# Patient Record
Sex: Male | Born: 1955 | Race: White | Hispanic: No | Smoking: Never smoker
Health system: Southern US, Community
[De-identification: ages and names within clinical notes are randomized; demographics above are authoritative.]

## PROBLEM LIST (undated history)

## (undated) DIAGNOSIS — E785 Hyperlipidemia, unspecified: Secondary | ICD-10-CM

## (undated) DIAGNOSIS — N289 Disorder of kidney and ureter, unspecified: Secondary | ICD-10-CM

## (undated) DIAGNOSIS — I10 Essential (primary) hypertension: Secondary | ICD-10-CM

---

## 2017-07-29 ENCOUNTER — Encounter: Payer: Self-pay | Admitting: Emergency Medicine

## 2017-07-29 ENCOUNTER — Emergency Department
Admission: EM | Admit: 2017-07-29 | Discharge: 2017-07-29 | Disposition: A | Discharge status: Home / Self Care | Payer: BLUE CROSS/BLUE SHIELD | Attending: Emergency Medicine | Admitting: Emergency Medicine

## 2017-07-29 ENCOUNTER — Emergency Department: Payer: BLUE CROSS/BLUE SHIELD

## 2017-07-29 DIAGNOSIS — E876 Hypokalemia: Secondary | ICD-10-CM | POA: Insufficient documentation

## 2017-07-29 DIAGNOSIS — I10 Essential (primary) hypertension: Secondary | ICD-10-CM | POA: Diagnosis not present

## 2017-07-29 DIAGNOSIS — R1031 Right lower quadrant pain: Secondary | ICD-10-CM | POA: Diagnosis not present

## 2017-07-29 DIAGNOSIS — N23 Unspecified renal colic: Secondary | ICD-10-CM

## 2017-07-29 DIAGNOSIS — R109 Unspecified abdominal pain: Secondary | ICD-10-CM

## 2017-07-29 HISTORY — DX: Hyperlipidemia, unspecified: E78.5

## 2017-07-29 HISTORY — DX: Disorder of kidney and ureter, unspecified: N28.9

## 2017-07-29 HISTORY — DX: Essential (primary) hypertension: I10

## 2017-07-29 LAB — BASIC METABOLIC PANEL
Anion gap: 10 (ref 5–15)
BUN: 16 mg/dL (ref 6–20)
CHLORIDE: 107 mmol/L (ref 101–111)
CO2: 25 mmol/L (ref 22–32)
Calcium: 8.9 mg/dL (ref 8.9–10.3)
Creatinine, Ser: 1.05 mg/dL (ref 0.61–1.24)
GFR calc Af Amer: >60 mL/min (ref >60)
GFR calc non Af Amer: >60 mL/min (ref >60)
Glucose, Bld: 160 mg/dL — ABNORMAL HIGH (ref 65–99)
POTASSIUM: 3.1 mmol/L — AB (ref 3.5–5.1)
SODIUM: 142 mmol/L (ref 135–145)

## 2017-07-29 LAB — URINALYSIS, COMPLETE (UACMP) WITH MICROSCOPIC
BACTERIA UA: NONE SEEN
Bilirubin Urine: NEGATIVE
GLUCOSE, UA: NEGATIVE mg/dL
KETONES UR: 5 mg/dL — AB
Leukocytes, UA: NEGATIVE
Nitrite: NEGATIVE
PH: 7 (ref 5.0–8.0)
Protein, ur: NEGATIVE mg/dL
SPECIFIC GRAVITY, URINE: 1.012 (ref 1.005–1.030)

## 2017-07-29 LAB — CBC
HEMATOCRIT: 42.8 % (ref 40.0–52.0)
HEMOGLOBIN: 15.1 g/dL (ref 13.0–18.0)
MCH: 29.9 pg (ref 26.0–34.0)
MCHC: 35.3 g/dL (ref 32.0–36.0)
MCV: 84.7 fL (ref 80.0–100.0)
Platelets: 237 10*3/uL (ref 150–440)
RBC: 5.05 MIL/uL (ref 4.40–5.90)
RDW: 13.5 % (ref 11.5–14.5)
WBC: 6 10*3/uL (ref 3.8–10.6)

## 2017-07-29 MED ORDER — HYDROMORPHONE HCL 1 MG/ML IJ SOLN
0.5000 mg | Freq: Once | INTRAMUSCULAR | Status: AC
  Administered 2017-07-29: 0.5 mg via INTRAVENOUS

## 2017-07-29 MED ORDER — POTASSIUM CHLORIDE CRYS ER 20 MEQ PO TBCR
40.0000 meq | EXTENDED_RELEASE_TABLET | Freq: Once | ORAL | Status: AC
  Administered 2017-07-29: 40 meq via ORAL
  Filled 2017-07-29: qty 2

## 2017-07-29 MED ORDER — OXYCODONE-ACETAMINOPHEN 5-325 MG PO TABS: 1.0000 | ORAL_TABLET | ORAL | 0 refills | Status: AC | PRN

## 2017-07-29 MED ORDER — TAMSULOSIN HCL 0.4 MG PO CAPS: 0.4000 mg | ORAL_CAPSULE | Freq: Every day | ORAL | 0 refills | Status: AC

## 2017-07-29 MED ORDER — SODIUM CHLORIDE 0.9 % IV BOLUS: 1000.0000 mL | Freq: Once | INTRAVENOUS | Status: DC

## 2017-07-29 MED ORDER — ONDANSETRON 4 MG PO TBDP: 4.0000 mg | ORAL_TABLET | Freq: Three times a day (TID) | ORAL | 0 refills | Status: AC | PRN

## 2017-07-29 MED ORDER — HYDROMORPHONE HCL 1 MG/ML IJ SOLN
0.5000 mg | Freq: Once | INTRAMUSCULAR | Status: AC
  Administered 2017-07-29: 0.5 mg via INTRAVENOUS
  Filled 2017-07-29: qty 1

## 2017-07-29 MED ORDER — SODIUM CHLORIDE 0.9 % IV BOLUS
1000.0000 mL | Freq: Once | INTRAVENOUS | Status: AC
  Administered 2017-07-29: 1000 mL via INTRAVENOUS

## 2017-07-29 MED ORDER — TAMSULOSIN HCL 0.4 MG PO CAPS
0.4000 mg | ORAL_CAPSULE | Freq: Once | ORAL | Status: AC
  Administered 2017-07-29: 0.4 mg via ORAL
  Filled 2017-07-29: qty 1

## 2017-07-29 MED ORDER — OXYCODONE-ACETAMINOPHEN 5-325 MG PO TABS
1.0000 | ORAL_TABLET | Freq: Once | ORAL | Status: AC
  Administered 2017-07-29: 1 via ORAL
  Filled 2017-07-29: qty 1

## 2017-07-29 MED ORDER — HYDROMORPHONE HCL 1 MG/ML IJ SOLN
INTRAMUSCULAR | Status: AC
  Filled 2017-07-29: qty 1

## 2017-07-29 MED ORDER — KETOROLAC TROMETHAMINE 30 MG/ML IJ SOLN
10.0000 mg | Freq: Once | INTRAMUSCULAR | Status: AC
  Administered 2017-07-29: 9.9 mg via INTRAVENOUS

## 2017-07-29 MED ORDER — ONDANSETRON HCL 4 MG/2ML IJ SOLN
4.0000 mg | Freq: Once | INTRAMUSCULAR | Status: AC
  Administered 2017-07-29: 4 mg via INTRAVENOUS
  Filled 2017-07-29: qty 2

## 2017-07-29 MED ORDER — KETOROLAC TROMETHAMINE 30 MG/ML IJ SOLN
INTRAMUSCULAR | Status: AC
  Filled 2017-07-29: qty 1

## 2017-07-29 NOTE — Discharge Instructions (Signed)
1.  You may take ibuprofen as needed for pain.  Take Percocet (#30) as needed for more severe pain. 2.  You may take Zofran (#30) as needed for nausea. 3.  Take Flomax daily for the next 2 weeks. 4.  Drink plenty of bottled or filtered water daily. 5.  Return to the ER for worsening symptoms, persistent vomiting, fever or other concerns.

## 2017-07-29 NOTE — ED Provider Notes (Signed)
[facility] Emergency Department Provider Note   ____________________________________________   First MD Initiated Contact with Patient 07/29/17 0406     (approximate)  I have reviewed the triage vital signs and the nursing notes.   HISTORY  Chief Complaint Flank Pain    HPI Douglas Chen is a 62 y.o. male who presents to the ED from home with a chief complaint of flank pain.  Patient awoke from sleep suddenly approximately 1:30 AM with right flank pain radiating to his right testicle.  Symptoms associated with nausea.  Remote history of left-sided kidney stone 20 years ago which did not require lithotripsy or stenting.  Denies associated fever, chills, chest pain, shortness of breath, hematuria.  Denies recent travel or trauma.   Past Medical History:  Diagnosis Date  . Hyperlipemia   . Hypertension   . Renal disorder    kidney stone    There are no active problems to display for this patient.   History reviewed. No pertinent surgical history.  Prior to Admission medications   Medication Sig Start Date End Date Taking? Authorizing Provider  ondansetron (ZOFRAN ODT) 4 MG disintegrating tablet Take 1 tablet (4 mg total) by mouth every 8 (eight) hours as needed for nausea or vomiting. 07/29/17   Irean Hong, MD  oxyCODONE-acetaminophen (PERCOCET/ROXICET) 5-325 MG tablet Take 1 tablet by mouth every 4 (four) hours as needed for severe pain. 07/29/17   Irean Hong, MD  tamsulosin (FLOMAX) 0.4 MG CAPS capsule Take 1 capsule (0.4 mg total) by mouth daily. 07/29/17   Irean Hong, MD    Allergies Patient has no known allergies.  No family history on file.  Social History Social History   Tobacco Use  . Smoking status: Never Smoker  . Smokeless tobacco: Never Used  Substance Use Topics  . Alcohol use: Not on file  . Drug use: Not on file    Review of Systems  Constitutional: No fever/chills. Eyes: No visual changes. ENT: No sore  throat. Cardiovascular: Denies chest pain. Respiratory: Denies shortness of breath. Gastrointestinal: Positive for right flank pain.  No abdominal pain.  No nausea, no vomiting.  No diarrhea.  No constipation. Genitourinary: Negative for dysuria. Musculoskeletal: Negative for back pain. Skin: Negative for rash. Neurological: Negative for headaches, focal weakness or numbness.   ____________________________________________   PHYSICAL EXAM:  VITAL SIGNS: ED Triage Vitals  Enc Vitals Group     BP 07/29/17 0333 (!) 110/91     Pulse Rate 07/29/17 0333 77     Resp 07/29/17 0333 18     Temp 07/29/17 0333 97.6 F (36.4 C)     Temp Source 07/29/17 0333 Oral     SpO2 07/29/17 0333 96 %     Weight 07/29/17 0334 270 lb (122.5 kg)     Height 07/29/17 0334  (1.753 m)     Head Circumference --      Peak Flow --      Pain Score 07/29/17 0334 10     Pain Loc --      Pain Edu? --      Excl. in GC? --     Constitutional: Alert and oriented. Well appearing and in no acute distress. Eyes: Conjunctivae are normal. PERRL. EOMI. Head: Atraumatic. Nose: No congestion/rhinnorhea. Mouth/Throat: Mucous membranes are moist.  Oropharynx non-erythematous. Neck: No stridor.   Cardiovascular: Normal rate, regular rhythm. Grossly normal heart sounds.  Good peripheral circulation. Respiratory: Normal respiratory effort.  No  retractions. Lungs CTAB. Gastrointestinal: Soft and nontender to light or deep palpation. No distention. No abdominal bruits.  Mild right CVA tenderness. Musculoskeletal: No lower extremity tenderness nor edema.  No joint effusions. Neurologic:  Normal speech and language. No gross focal neurologic deficits are appreciated. No gait instability. Skin:  Skin is warm, dry and intact. No rash noted. Psychiatric: Mood and affect are normal. Speech and behavior are normal.  ____________________________________________   LABS (all labs ordered are listed, but only abnormal results  are displayed)  Labs Reviewed  URINALYSIS, COMPLETE (UACMP) WITH MICROSCOPIC - Abnormal; Notable for the following components:      Result Value   Color, Urine YELLOW (*)    APPearance CLEAR (*)    Hgb urine dipstick SMALL (*)    Ketones, ur 5 (*)    All other components within normal limits  BASIC METABOLIC PANEL - Abnormal; Notable for the following components:   Potassium 3.1 (*)    Glucose, Bld 160 (*)    All other components within normal limits  CBC   ____________________________________________  EKG  None ____________________________________________  RADIOLOGY  ED MD interpretation: 3 mm distal right ureteral stone with moderate proximal obstruction  Official radiology report(s): Ct Renal Stone Study  Result Date: 07/29/2017 CLINICAL DATA:  Right lower back pain woke the patient from sleep this morning. Pain radiates to the groin. EXAM: CT ABDOMEN AND PELVIS WITHOUT CONTRAST TECHNIQUE: Multidetector CT imaging of the abdomen and pelvis was performed following the standard protocol without IV contrast. COMPARISON:  None. FINDINGS: Lower chest: Mild dependent atelectasis in the lung bases. Hepatobiliary: Mild diffuse fatty infiltration of the liver. Subcentimeter low-attenuation lesion in segment 6 is too small to characterize but probably represents a small cyst or hemangioma. Gallbladder and bile ducts are unremarkable. Pancreas: Unremarkable. No pancreatic ductal dilatation or surrounding inflammatory changes. Spleen: Normal in size without focal abnormality. Adrenals/Urinary Tract: No adrenal gland nodules. 6 mm stone in a right renal upper pole calyx. Mild right hydronephrosis and hydroureter down to a 3 mm stone in the distal right ureter just above the ureterovesical junction. Mild stranding around the right kidney and ureter. Left collecting system and ureter are decompressed. Cyst in the upper pole left kidney. Bladder wall is not thickened and no bladder stones are  identified. Stomach/Bowel: Stomach is within normal limits. Appendix appears normal. No evidence of bowel wall thickening, distention, or inflammatory changes. Vascular/Lymphatic: Aortic atherosclerosis. No enlarged abdominal or pelvic lymph nodes. Reproductive: Prostate is unremarkable. Other: No abdominal wall hernia or abnormality. No abdominopelvic ascites. Musculoskeletal: No acute or significant osseous findings. IMPRESSION: 1. 3 mm stone in the distal right ureter with moderate proximal obstruction. 2. 6 mm nonobstructing intrarenal stone in the right kidney. 3. Diffuse fatty infiltration of the liver. 4. Subcentimeter low-attenuation and hepatic segment 6 too small to characterize. 5. Mild aortic atherosclerosis. Electronically Signed   By: Burman Nieves M.D.   On: 07/29/2017 04:40    ____________________________________________   PROCEDURES  Procedure(s) performed: None  Procedures  Critical Care performed: No  ____________________________________________   INITIAL IMPRESSION / ASSESSMENT AND PLAN / ED COURSE  As part of my medical decision making, I reviewed the following data within the electronic MEDICAL RECORD NUMBER History obtained from family, Nursing notes reviewed and incorporated, Labs reviewed, Radiograph reviewed and Notes from prior ED visits   62 year old male who presents with right flank pain; remote history of kidney stone. Differential diagnosis includes, but is not limited to, acute appendicitis, renal  colic, testicular torsion, urinary tract infection/pyelonephritis, prostatitis,  epididymitis, diverticulitis, small bowel obstruction or ileus, colitis, abdominal aortic aneurysm, gastroenteritis, hernia, etc.  Patient arrived to the treatment room in significant pain.  IV Dilaudid was given without any relief of symptoms.  IV Toradol ordered.  Symptoms suspicious for renal colic.  Will initiate IV fluid resuscitation and proceed with CT renal colic  protocol.   Clinical Course as of Jul 30 655  Sat Jul 29, 2017  0420 Pain much improved after IV Toradol.  Awaiting CT scan.   [JS]  [phone] Updated patient and family members of CT imaging results.  Will add Flomax at this time.  Awaiting urine specimen.   [JS]  [phone] Updated patient and family members of urinalysis results.  Will discharge home with a 2-week course of Flomax, Percocet and Zofran as needed, and patient will follow-up with urology.  Strict return precautions given.  All verbalize understanding and agree with plan of care.   [JS]    Clinical Course User Index [JS] Irean Hong, MD     ____________________________________________   FINAL CLINICAL IMPRESSION(S) / ED DIAGNOSES  Final diagnoses:  Right flank pain  Hypokalemia  Ureteral colic     ED Discharge Orders        Ordered    tamsulosin (FLOMAX) 0.4 MG CAPS capsule  Daily     07/29/17 0628    ondansetron (ZOFRAN ODT) 4 MG disintegrating tablet  Every 8 hours PRN     07/29/17 0628    oxyCODONE-acetaminophen (PERCOCET/ROXICET) 5-325 MG tablet  Every 4 hours PRN     07/29/17 5409       Note:  This document was prepared using Dragon voice recognition software and may include unintentional dictation errors.    Irean Hong, MD 07/29/17 [phone]

## 2017-07-29 NOTE — ED Triage Notes (Signed)
Patient states that about 01:30 this morning he had right lower back pain that woke him out of his sleep. Patient states that the pain radiates to his groin at times. Patient denies any pain with urination.

## 2019-07-15 IMAGING — CT CT RENAL STONE PROTOCOL
2 of 4 series · 16 of 46 positions shown, 18 images · non-contrast
Comparison: None.

CLINICAL DATA: Right lower back pain woke the patient from sleep
this morning. Pain radiates to the groin.

EXAM:
CT ABDOMEN AND PELVIS WITHOUT CONTRAST
TECHNIQUE: Multidetector CT imaging of the abdomen and pelvis was performed
following the standard protocol without IV contrast.

[Series 2: stone full standard · axial · 0.88mm/px · z∈[-928,-448]mm · 13 of 106 slices shown, 15 images]
[im 5/106  soft-tissue]
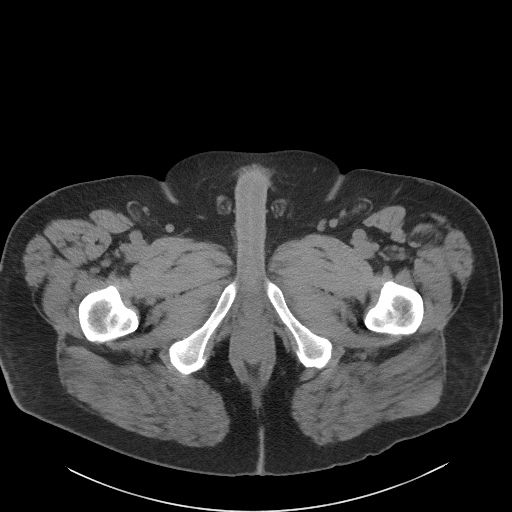
[im 5/106  bone]
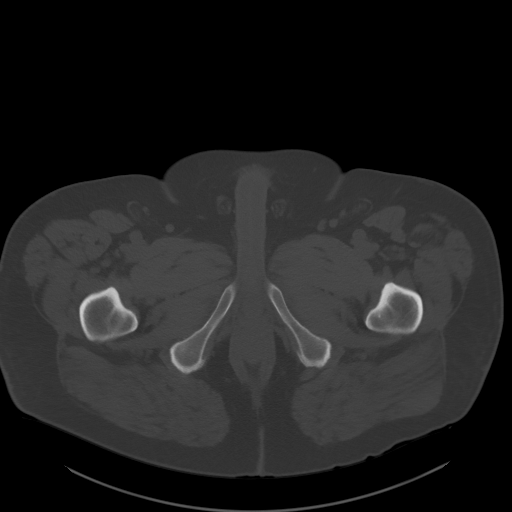
[im 14/106  soft-tissue]
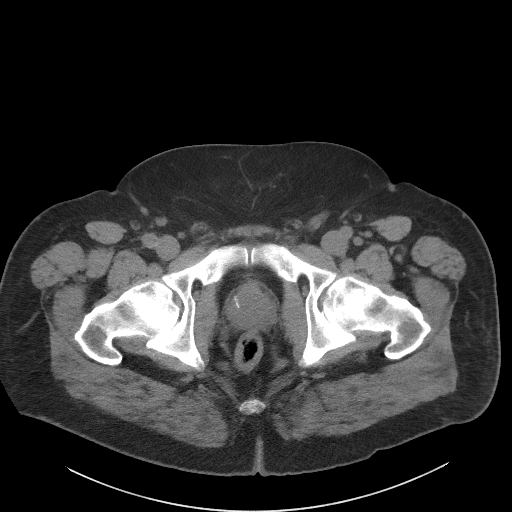
[im 23/106  soft-tissue]
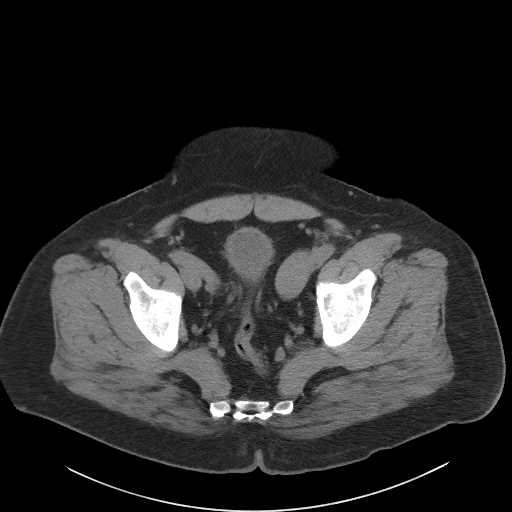
[im 28/106  soft-tissue]
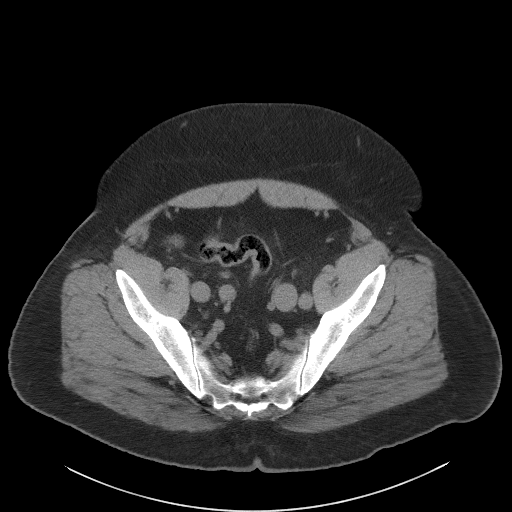
[im 37/106  soft-tissue]
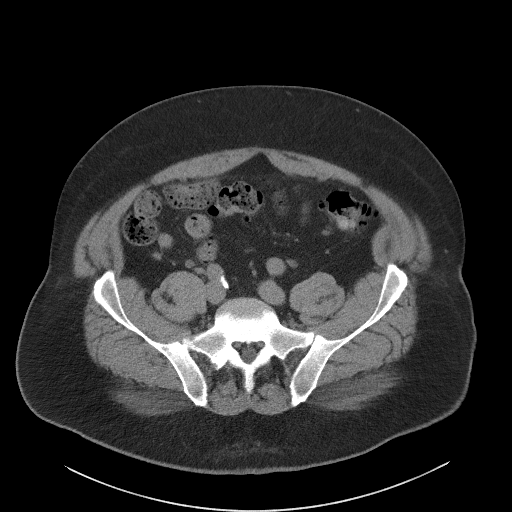
[im 46/106  soft-tissue]
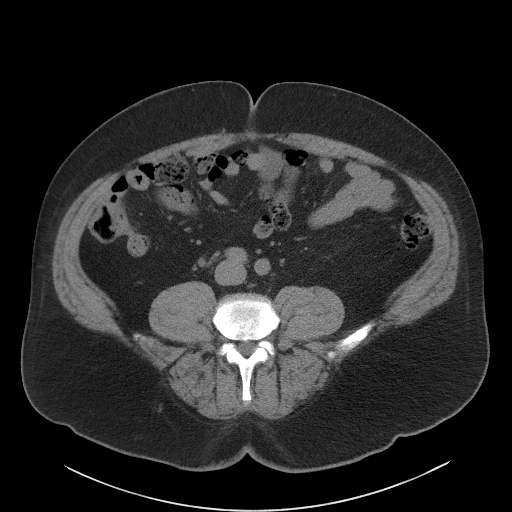
[im 55/106  soft-tissue]
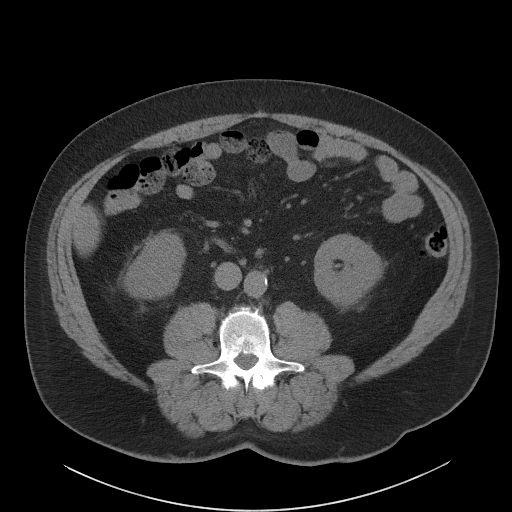
[im 60/106  soft-tissue]
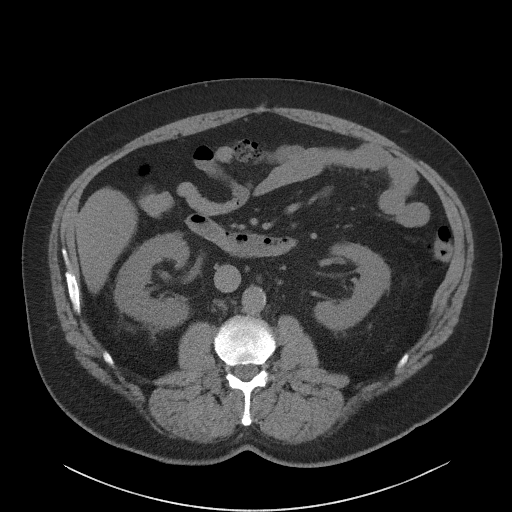
[im 69/106  soft-tissue]
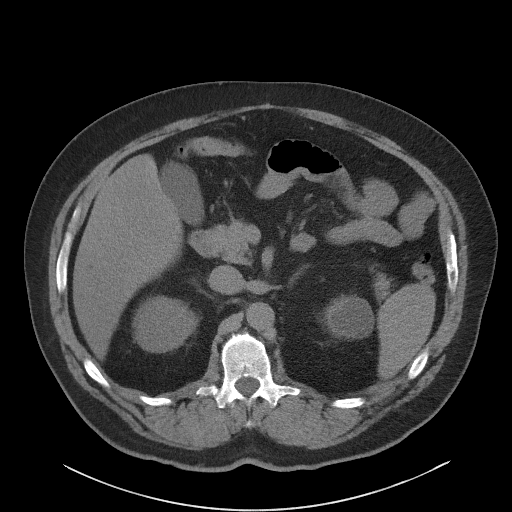
[im 69/106  bone]
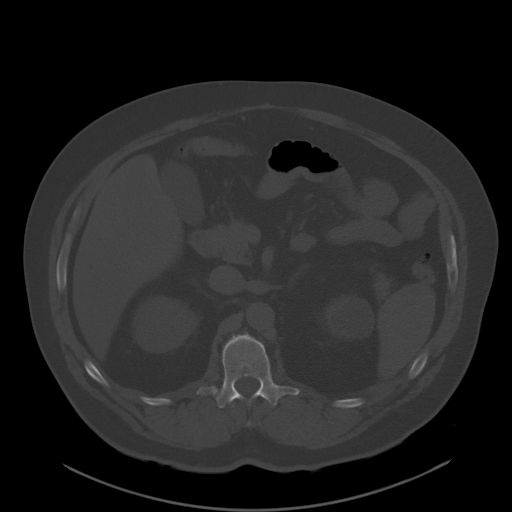
[im 78/106  soft-tissue]
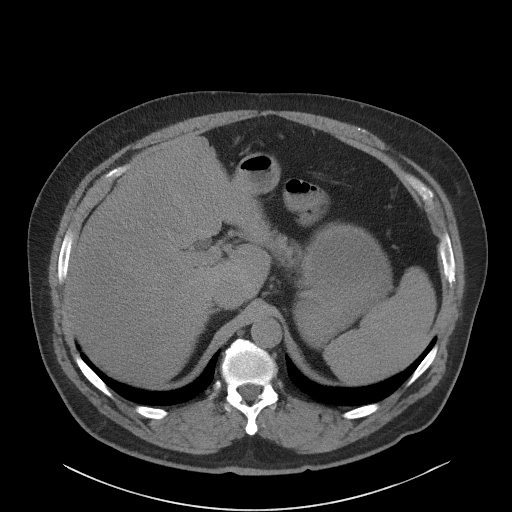
[im 83/106  soft-tissue]
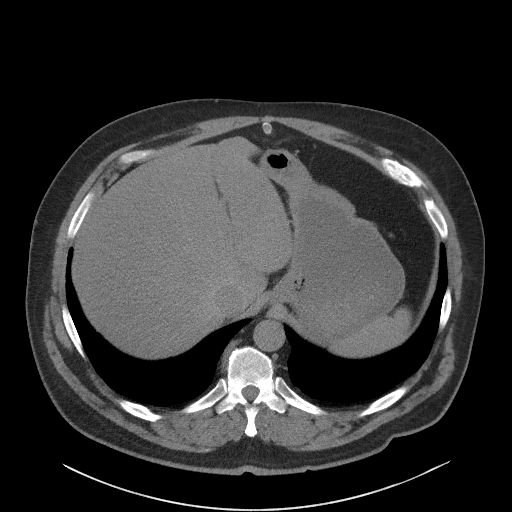
[im 92/106  soft-tissue]
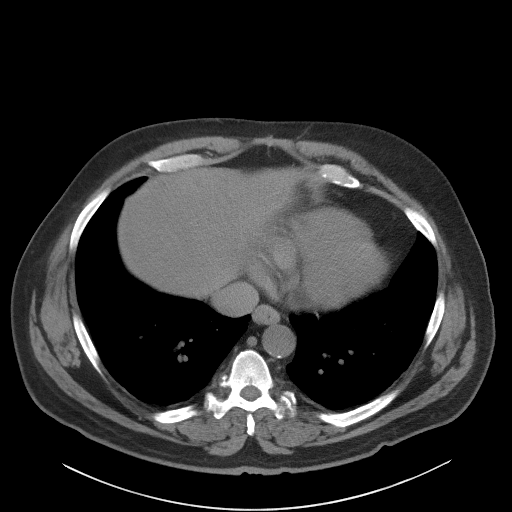
[im 101/106  soft-tissue]
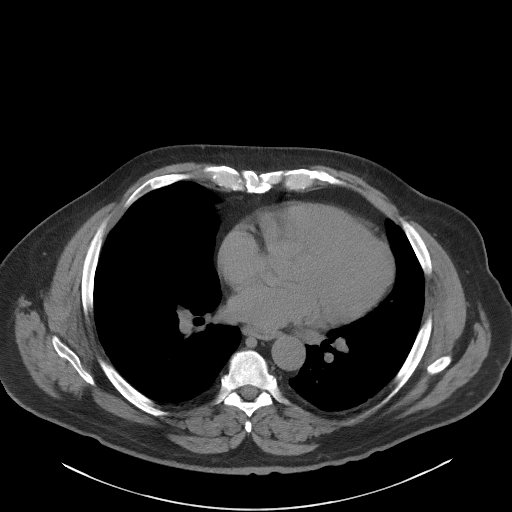

[Series 5: coronal · coronal · 0.82mm/px · 3 of 164 slices shown]
[im 55/164  soft-tissue]
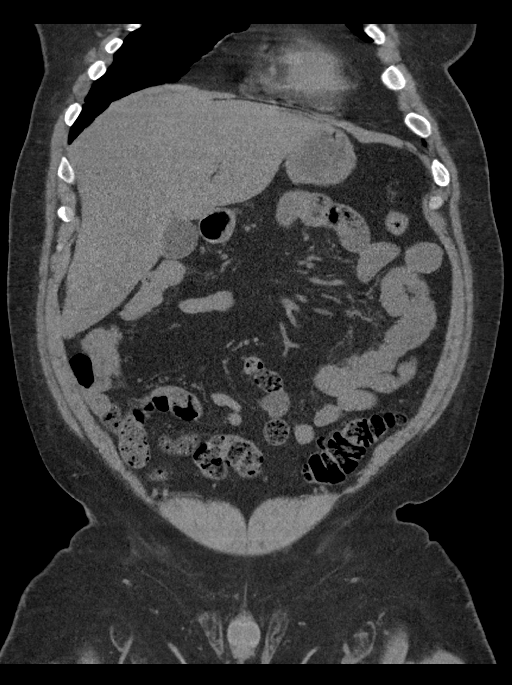
[im 73/164  soft-tissue]
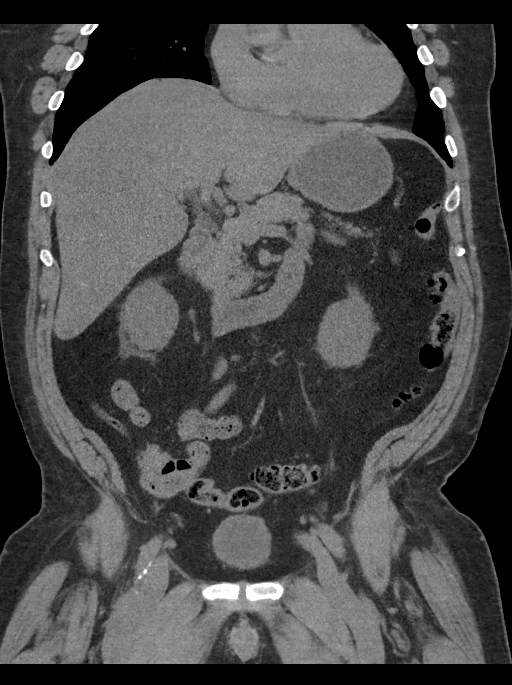
[im 91/164  soft-tissue]
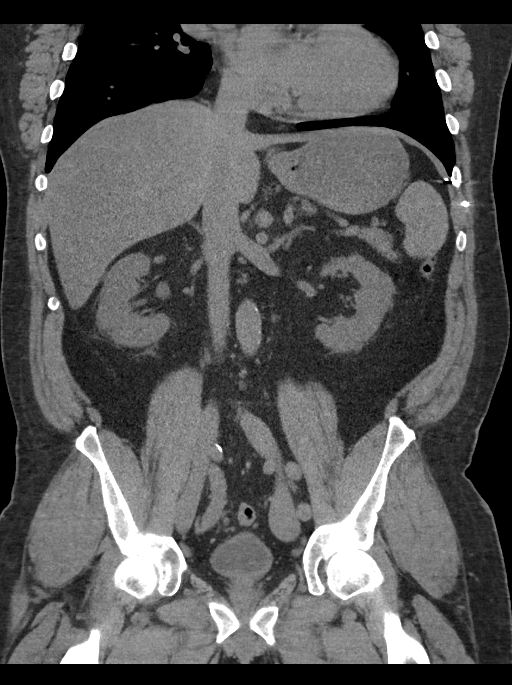

[16 of 46 positions shown; findings below may reference images not displayed]

FINDINGS: Lower chest: Mild dependent atelectasis in the lung bases.

Hepatobiliary: Mild diffuse fatty infiltration of the liver.
Subcentimeter low-attenuation lesion in segment 6 is too small to
characterize but probably represents a small cyst or hemangioma.
Gallbladder and bile ducts are unremarkable.

Pancreas: Unremarkable. No pancreatic ductal dilatation or
surrounding inflammatory changes.

Spleen: Normal in size without focal abnormality.

Adrenals/Urinary Tract: No adrenal gland nodules. 6 mm stone in a
right renal upper pole calyx. Mild right hydronephrosis and
hydroureter down to a 3 mm stone in the distal right ureter just
above the ureterovesical junction. Mild stranding around the right
kidney and ureter. Left collecting system and ureter are
decompressed. Cyst in the upper pole left kidney. Bladder wall is
not thickened and no bladder stones are identified.

Stomach/Bowel: Stomach is within normal limits. Appendix appears
normal. No evidence of bowel wall thickening, distention, or
inflammatory changes.

Vascular/Lymphatic: Aortic atherosclerosis. No enlarged abdominal or
pelvic lymph nodes.

Reproductive: Prostate is unremarkable.

Other: No abdominal wall hernia or abnormality. No abdominopelvic
ascites.

Musculoskeletal: No acute or significant osseous findings.
IMPRESSION: 1. 3 mm stone in the distal right ureter with moderate proximal
obstruction.
2. 6 mm nonobstructing intrarenal stone in the right kidney.
3. Diffuse fatty infiltration of the liver.
4. Subcentimeter low-attenuation and hepatic segment 6 too small to
characterize.
5. Mild aortic atherosclerosis.
